# Patient Record
Sex: Male | Born: 1950 | Race: Black or African American | Hispanic: No | Smoking: Never smoker
Health system: Southern US, Community
[De-identification: ages and names within clinical notes are randomized; demographics above are authoritative.]

## PROBLEM LIST (undated history)

## (undated) DIAGNOSIS — I639 Cerebral infarction, unspecified: Secondary | ICD-10-CM

---

## 2017-01-27 ENCOUNTER — Emergency Department: Payer: 59

## 2017-01-27 ENCOUNTER — Encounter: Payer: Self-pay | Admitting: Emergency Medicine

## 2017-01-27 ENCOUNTER — Emergency Department
Admission: EM | Admit: 2017-01-27 | Discharge: 2017-01-27 | Disposition: A | Discharge status: Home / Self Care | Payer: 59 | Attending: Emergency Medicine | Admitting: Emergency Medicine

## 2017-01-27 DIAGNOSIS — R42 Dizziness and giddiness: Secondary | ICD-10-CM | POA: Diagnosis present

## 2017-01-27 DIAGNOSIS — G459 Transient cerebral ischemic attack, unspecified: Secondary | ICD-10-CM | POA: Diagnosis not present

## 2017-01-27 DIAGNOSIS — Z8673 Personal history of transient ischemic attack (TIA), and cerebral infarction without residual deficits: Secondary | ICD-10-CM | POA: Insufficient documentation

## 2017-01-27 HISTORY — DX: Cerebral infarction, unspecified: I63.9

## 2017-01-27 LAB — URINALYSIS, COMPLETE (UACMP) WITH MICROSCOPIC
BILIRUBIN URINE: NEGATIVE
Glucose, UA: NEGATIVE mg/dL
HGB URINE DIPSTICK: NEGATIVE
KETONES UR: 5 mg/dL — AB
Leukocytes, UA: NEGATIVE
Nitrite: NEGATIVE
PH: 5 (ref 5.0–8.0)
Protein, ur: 30 mg/dL — AB
SPECIFIC GRAVITY, URINE: 1.029 (ref 1.005–1.030)

## 2017-01-27 LAB — BASIC METABOLIC PANEL
Anion gap: 11 (ref 5–15)
BUN: 18 mg/dL (ref 6–20)
CALCIUM: 9.4 mg/dL (ref 8.9–10.3)
CO2: 22 mmol/L (ref 22–32)
CREATININE: 1.36 mg/dL — AB (ref 0.61–1.24)
Chloride: 106 mmol/L (ref 101–111)
GFR calc Af Amer: >60 mL/min (ref >60)
GFR calc non Af Amer: 53 mL/min — ABNORMAL LOW (ref >60)
GLUCOSE: 159 mg/dL — AB (ref 65–99)
Potassium: 3.9 mmol/L (ref 3.5–5.1)
Sodium: 139 mmol/L (ref 135–145)

## 2017-01-27 LAB — CBC
HCT: 41.7 % (ref 40.0–52.0)
Hemoglobin: 14.2 g/dL (ref 13.0–18.0)
MCH: 29.5 pg (ref 26.0–34.0)
MCHC: 34.1 g/dL (ref 32.0–36.0)
MCV: 86.6 fL (ref 80.0–100.0)
PLATELETS: 230 10*3/uL (ref 150–440)
RBC: 4.82 MIL/uL (ref 4.40–5.90)
RDW: 14.9 % — ABNORMAL HIGH (ref 11.5–14.5)
WBC: 7.2 10*3/uL (ref 3.8–10.6)

## 2017-01-27 LAB — TROPONIN I
Troponin I: <0.03 ng/mL (ref <0.03)
Troponin I: <0.03 ng/mL (ref <0.03)

## 2017-01-27 LAB — GLUCOSE, CAPILLARY: Glucose-Capillary: 138 mg/dL — ABNORMAL HIGH (ref 65–99)

## 2017-01-27 MED ORDER — ASPIRIN 81 MG PO CHEW
324.0000 mg | CHEWABLE_TABLET | Freq: Once | ORAL | Status: AC
  Administered 2017-01-27: 324 mg via ORAL
  Filled 2017-01-27: qty 4

## 2017-01-27 MED ORDER — ASPIRIN EC 81 MG PO TBEC: 81.0000 mg | DELAYED_RELEASE_TABLET | Freq: Every day | ORAL | 0 refills | Status: AC

## 2017-01-27 NOTE — ED Provider Notes (Signed)
[facility] Emergency Department Provider Note  ____________________________________________   First MD Initiated Contact with Patient 01/27/17 [phone]     (approximate)  I have reviewed the triage vital signs and the nursing notes.   HISTORY  Chief Complaint Dizziness   HPI Peter Lee is a 66 y.o. male with a history of stroke who is presenting to the emergency department tonight with an episode of dizziness that he experienced several hours ago while eating dinner. He says that he was sitting when the rhythm appeared to start spinning and his vision became blurred. He denied any slurred speech or focal weakness. 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 He says that he takes blood pressure medication at this time but does not take any anticoagulant, not even aspirin. He says that he had a beer earlier this evening as well but did not feel intoxicated. Does not drink or use drugs. Says that his symptoms lasted several hours and have now completely resolved. He says that he does not have any radiating in his ears at this time. No pressure in his ears bilaterally.   Past Medical History:  Diagnosis Date  . Stroke [facility])     There are no active problems to display for this patient.   History reviewed. No pertinent surgical history.  Prior to Admission medications   Not on File    Allergies Patient has no known allergies.  No family history on file.  Social History Social History  Substance Use Topics  . Smoking status: Never Smoker  . Smokeless tobacco: Never Used  . Alcohol use Not on file    Review of Systems  Constitutional: No fever/chills Eyes:as above ENT: No sore throat. Cardiovascular: Denies chest pain. Respiratory: Denies shortness of breath. Gastrointestinal: No abdominal pain.  No nausea, no vomiting.  No diarrhea.  No constipation. Genitourinary:  Negative for dysuria. Musculoskeletal: Negative for back pain. Skin: Negative for rash. Neurological: Negative for headaches, focal weakness or numbness.   ____________________________________________   PHYSICAL EXAM:  VITAL SIGNS: ED Triage Vitals  Enc Vitals Group     BP 01/27/17 0305 137/86     Pulse Rate 01/27/17 0305 78     Resp 01/27/17 0305 17     Temp --      Temp src --      SpO2 01/27/17 0305 99 %     Weight 01/27/17 0051 260 lb (117.9 kg)     Height 01/27/17 0051 6' (1.829 m)     Head Circumference --      Peak Flow --      Pain Score 01/27/17 0309 0     Pain Loc --      Pain Edu? --      Excl. in GC? --     Constitutional: Alert and oriented. Well appearing and in no acute distress. Eyes: Conjunctivae are normal.  Head: Atraumatic. Nose: No congestion/rhinnorhea. Mouth/Throat: Mucous membranes are moist.  Neck: No stridor.   Cardiovascular: Normal rate, regular rhythm. Grossly normal heart sounds.  Respiratory: Normal respiratory effort.  No retractions. Lungs CTAB. Gastrointestinal: Soft and nontender. No distention. No CVA tenderness. Musculoskeletal: No lower extremity tenderness nor edema.  No joint effusions. Neurologic:  Normal speech and language. No gross focal neurologic deficits are appreciated.no nystagmus. No ataxia on finger to nose testing. Skin:  Skin is warm, dry and intact. No rash noted. Psychiatric: Mood  and affect are normal. Speech and behavior are normal.  NIH Stroke Scale    Time: 3:22 AM Person Administering Scale: Arelia LongestSchaevitz,  Marvion Bastidas M  Administer stroke scale items in the order listed. Record performance in each category after each subscale exam. Do not go back and change scores. Follow directions provided for each exam technique. Scores should reflect what the patient does, not what the clinician thinks the patient can do. The clinician should record answers while administering the exam and work quickly. Except where indicated,  the patient should not be coached (i.e., repeated requests to patient to make a special effort).   1a  Level of consciousness: 0=alert; keenly responsive  1b. LOC questions:  0=Performs both tasks correctly  1c. LOC commands: 0=Performs both tasks correctly  2.  Best Gaze: 0=normal  3.  Visual: 0=No visual loss  4. Facial Palsy: 0=Normal symmetric movement  5a.  Motor left arm: 0=No drift, limb holds 90 (or 45) degrees for full 10 seconds  5b.  Motor right arm: 0=No drift, limb holds 90 (or 45) degrees for full 10 seconds  6a. motor left leg: 0=No drift, limb holds 90 (or 45) degrees for full 10 seconds  6b  Motor right leg:  0=No drift, limb holds 90 (or 45) degrees for full 10 seconds  7. Limb Ataxia: 0=Absent  8.  Sensory: 0=Normal; no sensory loss  9. Best Language:  0=No aphasia, normal  10. Dysarthria: 0=Normal  11. Extinction and Inattention: 0=No abnormality  12. Distal motor function: 0=Normal   Total:   0    ____________________________________________   LABS (all labs ordered are listed, but only abnormal results are displayed)  Labs Reviewed  BASIC METABOLIC PANEL - Abnormal; Notable for the following:       Result Value   Glucose, Bld 159 (*)    Creatinine, Ser 1.36 (*)    GFR calc non Af Amer 53 (*)    All other components within normal limits  CBC - Abnormal; Notable for the following:    RDW 14.9 (*)    All other components within normal limits  URINALYSIS, COMPLETE (UACMP) WITH MICROSCOPIC - Abnormal; Notable for the following:    Color, Urine AMBER (*)    APPearance HAZY (*)    Ketones, ur 5 (*)    Protein, ur 30 (*)    Bacteria, UA RARE (*)    Squamous Epithelial / LPF 0-5 (*)    All other components within normal limits  GLUCOSE, CAPILLARY - Abnormal; Notable for the following:    Glucose-Capillary 138 (*)    All other components within normal limits  TROPONIN I  CBG MONITORING, ED   ____________________________________________  EKG  ED ECG  REPORT I, Arelia LongestSchaevitz,  Mitzie Marlar M, the attending physician, personally viewed and interpreted this ECG.   Date: 01/27/2017  EKG Time: 057  Rate: 70  Rhythm: normal sinus rhythm  Axis: normal  Intervals:left anterior fascicular block  ST&T Change: no ST segment elevation or depression. T-wave inversions in II, III, and F aVF as well as biphasic T waves in aVL as well as 1. Also with inversions in V4 through 6.3  No previous for comparison  ____________________________________________  RADIOLOGY  CT without any acute pathology. Chronic microvascular ischemic changes noted  MRI without acute findings. ____________________________________________   PROCEDURES  Procedure(s) performed:   Procedures  Critical Care performed:   ____________________________________________   INITIAL IMPRESSION / ASSESSMENT AND PLAN / ED COURSE  Pertinent labs & imaging  results that were available during my care of the patient were reviewed by me and considered in my medical decision making (see chart for details).  ----------------------------------------- 3:48 AM on 01/27/2017 -----------------------------------------  I discussed the case with Dr. Amada Jupiter of neurology who recommends the patient be admitted because of this past risk factors as well as not having a neurologist as an outpatient. However, the patient does not want to be admitted. He says that he is also had similar symptoms in the past secondary to dehydration. We agreed on obtaining an MRI. If there is no evidence of stroke. Patient will be discharged and will follow-up in [place] with his primary care doctor for further evaluation and treatment.    ----------------------------------------- 5:04 AM on 01/27/2017 -----------------------------------------  Patient continues to be asymptomatic. We discussed admission to the hospital with the patient says that he would rather follow up with his primary care doctor in [place].  He'll be started on a baby aspirin. He is awarethat this is not the recommended treatment plan and thatleaving the hospital at this time may result in a stroke which could lead topermanent disability and/or death. He is understanding of the consequences of leaving without a complete workup for stroke at this time. He is not intoxicated at this time. He has capacity to make decisions and good insight into his illness.  ____________________________________________   FINAL CLINICAL IMPRESSION(S) / ED DIAGNOSES  TIA.    NEW MEDICATIONS STARTED DURING THIS VISIT:  New Prescriptions   No medications on file     Note:  This document was prepared using Dragon voice recognition software and may include unintentional dictation errors.     Myrna Blazer, MD 01/27/17 [phone]

## 2017-01-27 NOTE — ED Notes (Signed)
Patient transported to MRI at this time. 

## 2017-01-27 NOTE — ED Notes (Signed)
Pt made aware of medical need to stay by MD. Pt states he does not want to leave but will follow up as directed. Pt went over discharge paperwork and prescriptions.

## 2017-01-27 NOTE — ED Triage Notes (Addendum)
Pt to triage via w/c with no distress noted; pt reports onset dizziness upon [place] [employer]; denies any pain or other accomp c/o; pt st hx of same with hypotension and CVA 3868yrs ago; traveled here today from [place]

## 2017-01-28 LAB — URINE CULTURE: CULTURE: NO GROWTH

## 2019-03-25 IMAGING — CT CT HEAD W/O CM
3 series · 15 of 47 positions shown, 18 images · non-contrast
Comparison: None.

CLINICAL DATA: 66 y/o  M; persistent central vertigo.

EXAM:
CT HEAD WITHOUT CONTRAST
TECHNIQUE: Contiguous axial images were obtained from the base of the skull
through the vertex without intravenous contrast.

[Series 2: head wo · axial · 0.41mm/px · z∈[-102,+23]mm · 9 of 30 slices shown, 12 images]
[im 3/30  brain]
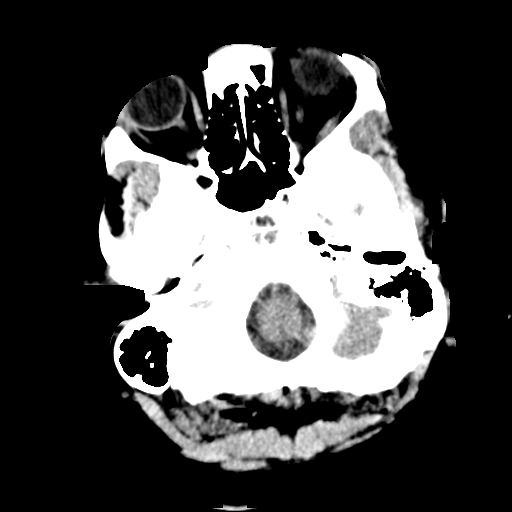
[im 3/30  bone]
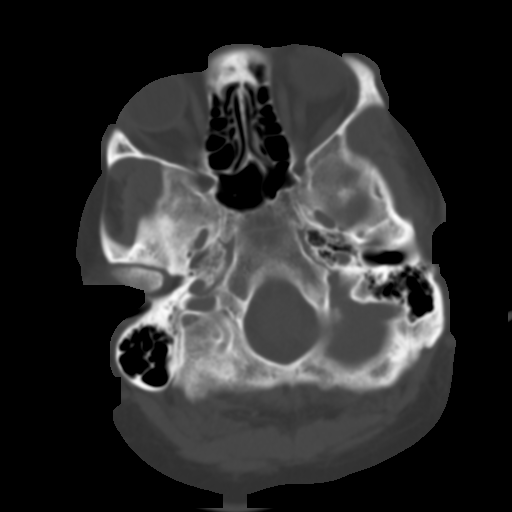
[im 6/30  brain]
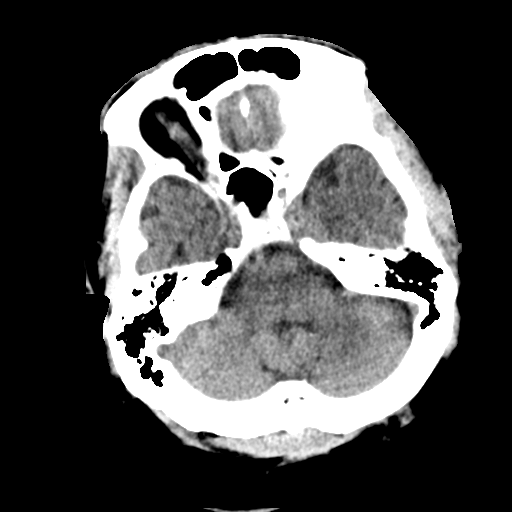
[im 9/30  brain]
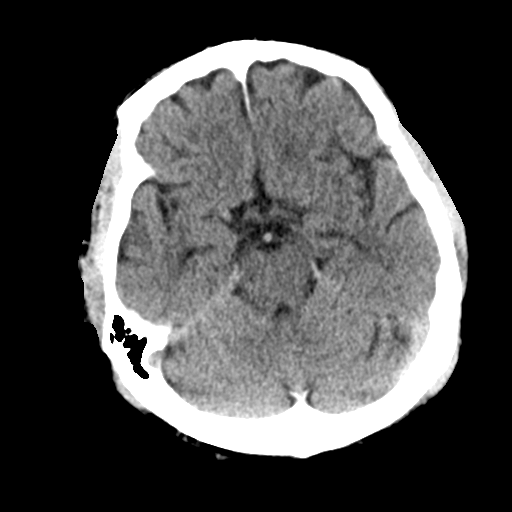
[im 12/30  brain]
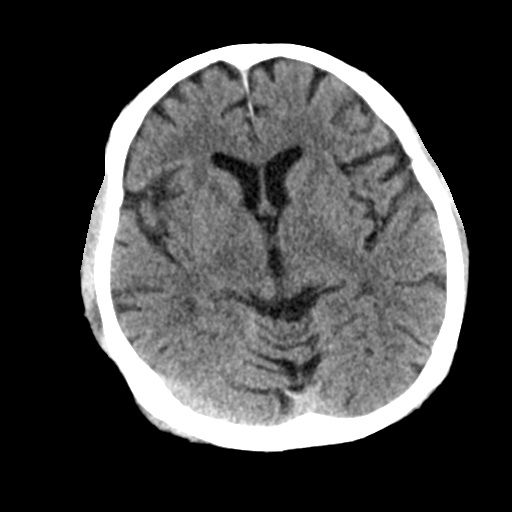
[im 16/30  brain]
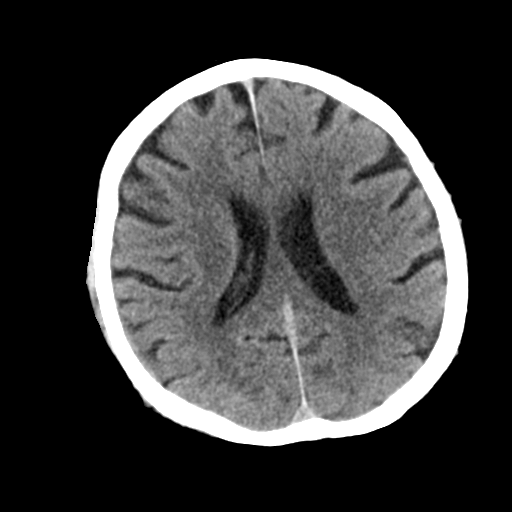
[im 16/30  bone]
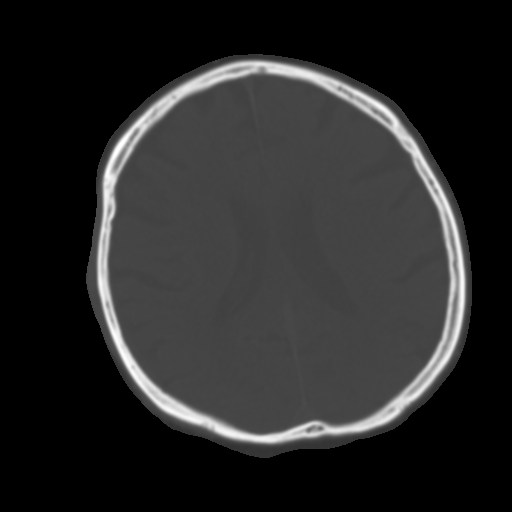
[im 19/30  brain]
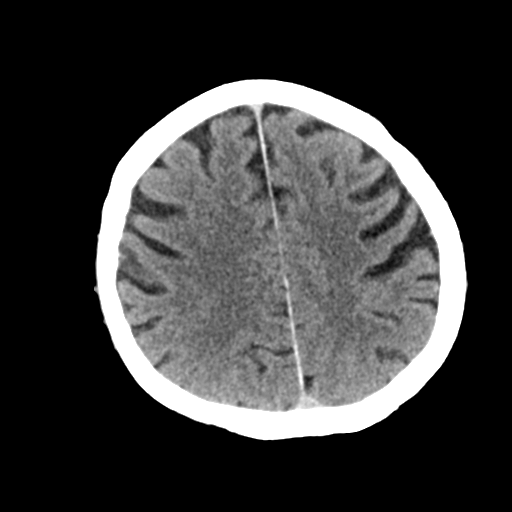
[im 22/30  brain]
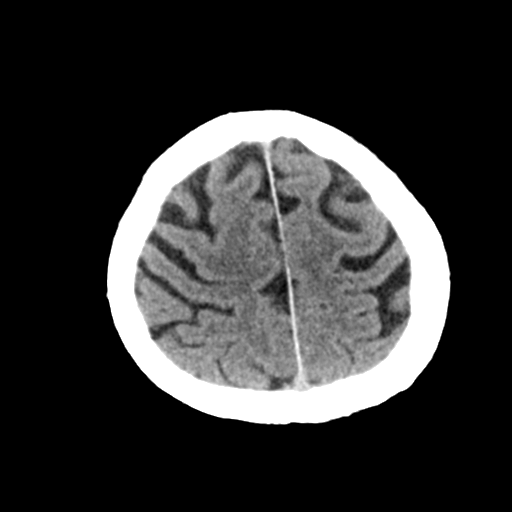
[im 25/30  brain]
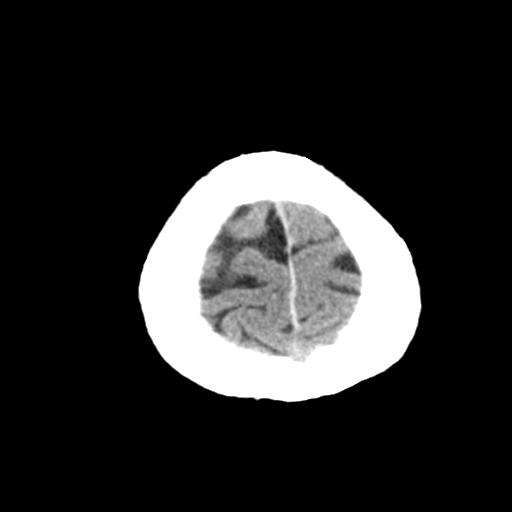
[im 28/30  brain]
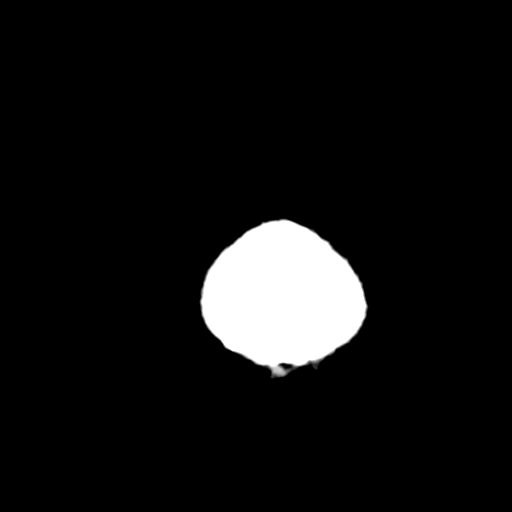
[im 28/30  bone]
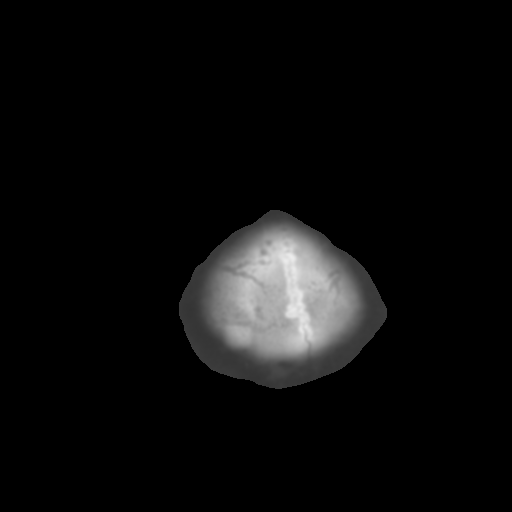

[Series 4: coronal soft tissue · coronal · 0.31mm/px · 3 of 59 slices shown]
[im 20/59  brain]
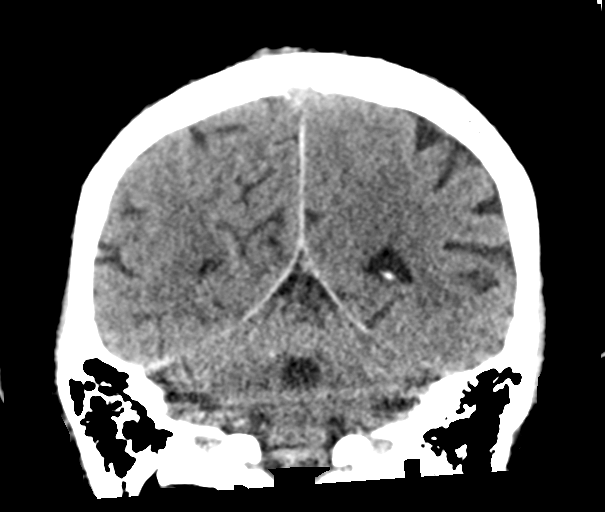
[im 26/59  brain]
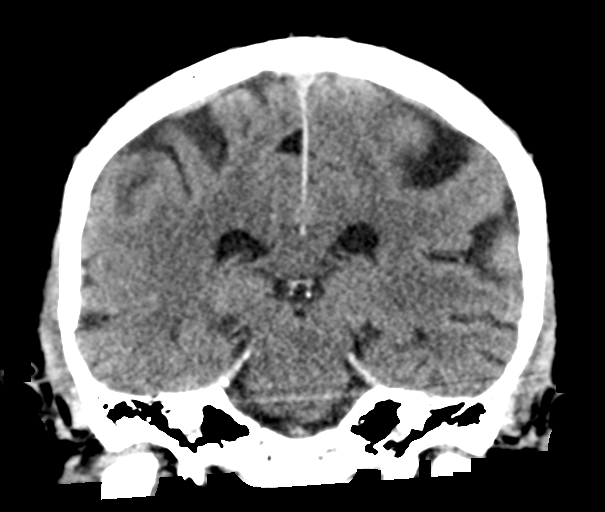
[im 33/59  brain]
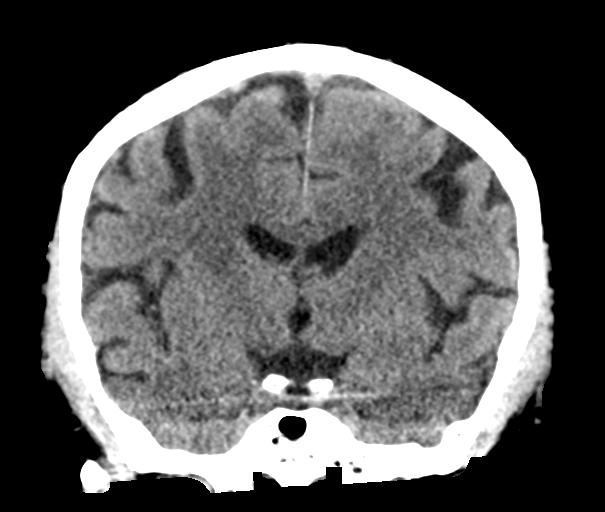

[Series 5: sagittal soft tissue · sagittal · 0.30mm/px · 3 of 57 slices shown]
[im 19/57  brain]
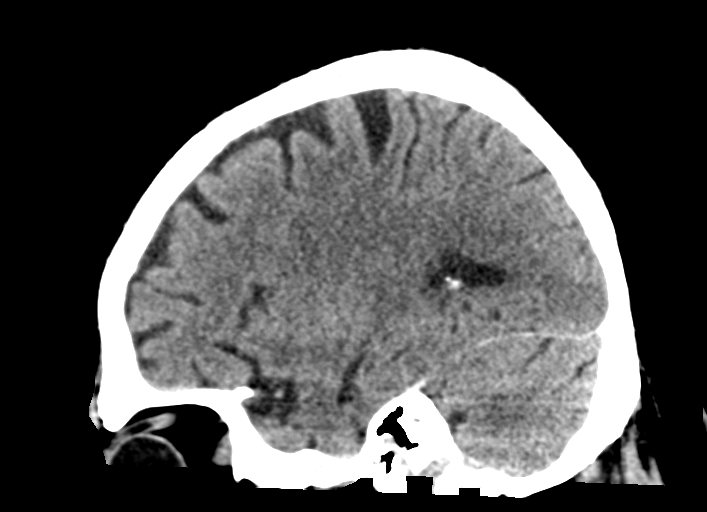
[im 29/57  brain]
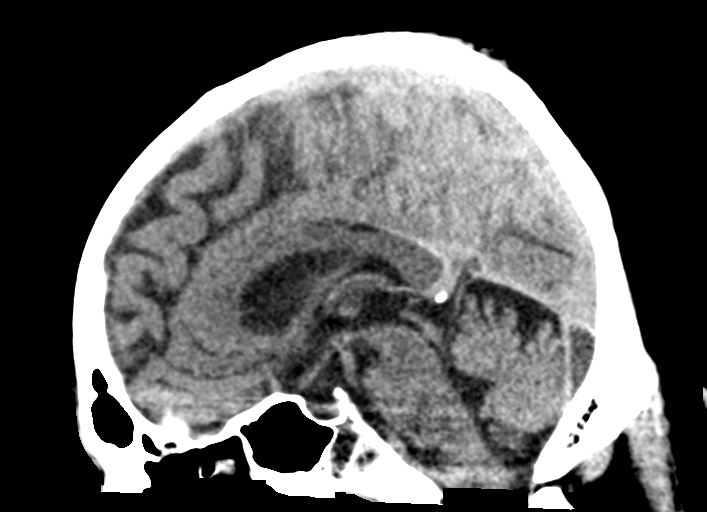
[im 38/57  brain]
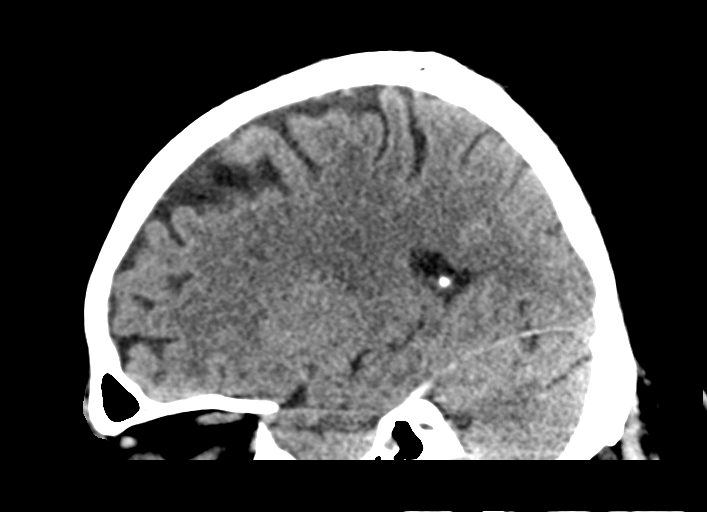

[15 of 47 positions shown; findings below may reference images not displayed]

FINDINGS: Brain: No evidence of acute infarction, hemorrhage, hydrocephalus,
extra-axial collection or mass lesion/mass effect. Mild chronic
microvascular ischemic changes and parenchymal volume loss of the
brain for age.

Vascular: No hyperdense vessel or unexpected calcification.

Skull: Normal. Negative for fracture or focal lesion.

Sinuses/Orbits: No acute finding.

Other: None.
IMPRESSION: 1. No acute intracranial abnormality identified.
2. Mild chronic microvascular ischemic changes and mild parenchymal
volume loss of the brain.

By: Rodii Him M.D.
